# Patient Record
Sex: Female | Born: 1965 | Hispanic: Yes | Smoking: Never smoker
Health system: Southern US, Community
[De-identification: ages and names within clinical notes are randomized; demographics above are authoritative.]

## PROBLEM LIST (undated history)

## (undated) DIAGNOSIS — M549 Dorsalgia, unspecified: Secondary | ICD-10-CM

## (undated) DIAGNOSIS — G43909 Migraine, unspecified, not intractable, without status migrainosus: Secondary | ICD-10-CM

## (undated) DIAGNOSIS — E119 Type 2 diabetes mellitus without complications: Secondary | ICD-10-CM

## (undated) DIAGNOSIS — J45909 Unspecified asthma, uncomplicated: Secondary | ICD-10-CM

## (undated) DIAGNOSIS — K529 Noninfective gastroenteritis and colitis, unspecified: Secondary | ICD-10-CM

## (undated) DIAGNOSIS — J383 Other diseases of vocal cords: Secondary | ICD-10-CM

## (undated) HISTORY — PX: TRACHEOSTOMY: SUR1362

---

## 2020-05-02 DIAGNOSIS — J386 Stenosis of larynx: Secondary | ICD-10-CM | POA: Insufficient documentation

## 2020-05-15 DIAGNOSIS — Z9889 Other specified postprocedural states: Secondary | ICD-10-CM | POA: Insufficient documentation

## 2020-06-28 ENCOUNTER — Encounter (HOSPITAL_BASED_OUTPATIENT_CLINIC_OR_DEPARTMENT_OTHER): Payer: Self-pay | Admitting: Emergency Medicine

## 2020-06-28 ENCOUNTER — Emergency Department (HOSPITAL_BASED_OUTPATIENT_CLINIC_OR_DEPARTMENT_OTHER)
Admission: EM | Admit: 2020-06-28 | Discharge: 2020-06-29 | Disposition: A | Discharge status: Home / Self Care | Payer: Medicare Other | Attending: Emergency Medicine | Admitting: Emergency Medicine

## 2020-06-28 ENCOUNTER — Other Ambulatory Visit: Payer: Self-pay

## 2020-06-28 DIAGNOSIS — J9502 Infection of tracheostomy stoma: Secondary | ICD-10-CM | POA: Insufficient documentation

## 2020-06-28 DIAGNOSIS — Z91040 Latex allergy status: Secondary | ICD-10-CM | POA: Diagnosis not present

## 2020-06-28 DIAGNOSIS — J029 Acute pharyngitis, unspecified: Secondary | ICD-10-CM | POA: Diagnosis present

## 2020-06-28 DIAGNOSIS — E119 Type 2 diabetes mellitus without complications: Secondary | ICD-10-CM | POA: Insufficient documentation

## 2020-06-28 DIAGNOSIS — J45909 Unspecified asthma, uncomplicated: Secondary | ICD-10-CM | POA: Diagnosis not present

## 2020-06-28 HISTORY — DX: Type 2 diabetes mellitus without complications: E11.9

## 2020-06-28 HISTORY — DX: Other diseases of vocal cords: J38.3

## 2020-06-28 HISTORY — DX: Migraine, unspecified, not intractable, without status migrainosus: G43.909

## 2020-06-28 HISTORY — DX: Dorsalgia, unspecified: M54.9

## 2020-06-28 HISTORY — DX: Noninfective gastroenteritis and colitis, unspecified: K52.9

## 2020-06-28 HISTORY — DX: Unspecified asthma, uncomplicated: J45.909

## 2020-06-28 NOTE — ED Triage Notes (Signed)
Reports green discharge from trach stoma x4 days, sore throat x2 days. Denies fevers.

## 2020-06-28 NOTE — ED Triage Notes (Signed)
No answer for triage x1 

## 2020-06-29 DIAGNOSIS — J9502 Infection of tracheostomy stoma: Secondary | ICD-10-CM | POA: Diagnosis not present

## 2020-06-29 MED ORDER — SODIUM CHLORIDE 0.9 % IV SOLN
2.0000 g | Freq: Once | INTRAVENOUS | Status: AC
  Administered 2020-06-29: 2 g via INTRAVENOUS
  Filled 2020-06-29: qty 2

## 2020-06-29 MED ORDER — LEVOFLOXACIN 750 MG PO TABS: 750.0000 mg | ORAL_TABLET | Freq: Every day | ORAL | 0 refills | Status: DC

## 2020-06-29 NOTE — ED Provider Notes (Signed)
[facility] Provider Note: Lowella Dell, MD, FACEP  CSN: [id number] MRN: [id number] ARRIVAL: 06/28/20 at 2319 ROOM: MH07/[facility]07   CHIEF COMPLAINT  Sore Throat   HISTORY OF PRESENT ILLNESS  06/29/20 1:44 AM Carolyn Rhodes is a 54 y.o. female who has a tracheostomy due to vocal cord dysfunction following surgery for cervical fracture several years ago.  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  She is here with 3 days of pain and greenish drainage from around her tracheostomy tube.  She has not had a fever but has had some malaise.  She rates the associated pain is a 6 out of 10, worse with movement of the tracheostomy tube.  She is concerned she may have a new Pseudomonas infection.  She is also concerned that her Pseudomonas has become resistant to Levaquin.  Records from [facility] (she recently moved here from [place]) were reviewed but no Pseudomonas culture results could be found.   Past Medical History:  Diagnosis Date  . Asthma   . Back pain   . Colitis   . Diabetes mellitus without complication (HCC)   . Migraines   . Vocal cord dysfunction     Past Surgical History:  Procedure Laterality Date  . TRACHEOSTOMY      No family history on file.  Social History   Tobacco Use  . Smoking status: Never Smoker  . Smokeless tobacco: Never Used  Substance Use Topics  . Alcohol use: Not Currently  . Drug use: Not Currently    Prior to Admission medications   Medication Sig Start Date End Date Taking? Authorizing Provider  levofloxacin (LEVAQUIN) 750 MG tablet Take 1 tablet (750 mg total) by mouth daily. X 7 days 06/29/20   Caeley Dohrmann, MD    Allergies Oxycodone-acetaminophen, Latex, Metoclopramide, Sumatriptan, and Nitrofurantoin   REVIEW OF SYSTEMS  Negative except as noted here or in the History of Present Illness.   PHYSICAL EXAMINATION  Initial Vital Signs Blood pressure (!) 152/90, pulse  83, temperature 98 F (36.7 C), temperature source Oral, resp. rate 20, weight 81.6 kg, SpO2 97 %.  Examination General: Well-developed, well-nourished female in no acute distress; appearance consistent with age of record HENT: normocephalic; tracheostomy tube and Passy-Muir valve with tenderness pale green drainage but no erythema or warmth, sample sent for culture Eyes: pupils equal, round and reactive to light; extraocular muscles intact Neck: supple Heart: regular rate and rhythm Lungs: clear to auscultation bilaterally Abdomen: soft; nondistended; nontender; bowel sounds present Extremities: No deformity; full range of motion; pulses normal Neurologic: Awake, alert and oriented; motor function intact in all extremities and symmetric; no facial droop Skin: Warm and dry Psychiatric: Normal mood and affect   RESULTS  Summary of this visit's results, reviewed and interpreted by myself:   EKG Interpretation  Date/Time:    Ventricular Rate:    PR Interval:    QRS Duration:   QT Interval:    QTC Calculation:   R Axis:     Text Interpretation:        Laboratory Studies: No results found for this or any previous visit (from the past 24 hour(s)). Imaging Studies: No results found.  ED COURSE and MDM  Nursing notes, initial and subsequent vitals signs, including pulse oximetry, reviewed and interpreted by myself.  Vitals:   06/28/20 2354 06/29/20 0000 06/29/20 0155  BP: (!) 152/90  136/78  Pulse: 83  73  Resp: 20  16  Temp: 98 F (36.7 C)    TempSrc: Oral    SpO2: 97%  100%  Weight:  81.6 kg    Medications  ceFEPIme (MAXIPIME) 2 g in sodium chloride 0.9 % 100 mL IVPB (2 g Intravenous New Bag/Given 06/29/20 0253)    Drainage from around tracheostomy tube was sent for culture.  We will give the patient 2 g of cefepime in the ED and sent home on Pseudomonas.  She was advised that if the culture does grow out a resistant organism she may need to have arrangements made for  IV antibiotics to clear the infection.  PROCEDURES  Procedures   ED DIAGNOSES     ICD-10-CM   1. Tracheostomy infection (HCC)  J95.02        Kierstin January, Jonny Ruiz, MD 06/29/20 0998

## 2020-07-03 LAB — AEROBIC CULTURE W GRAM STAIN (SUPERFICIAL SPECIMEN)

## 2020-09-01 ENCOUNTER — Emergency Department (HOSPITAL_BASED_OUTPATIENT_CLINIC_OR_DEPARTMENT_OTHER): Payer: Medicare Other

## 2020-09-01 ENCOUNTER — Emergency Department (HOSPITAL_BASED_OUTPATIENT_CLINIC_OR_DEPARTMENT_OTHER)
Admission: EM | Admit: 2020-09-01 | Discharge: 2020-09-01 | Disposition: A | Discharge status: Home / Self Care | Payer: Medicare Other | Attending: Emergency Medicine | Admitting: Emergency Medicine

## 2020-09-01 ENCOUNTER — Other Ambulatory Visit: Payer: Self-pay

## 2020-09-01 ENCOUNTER — Encounter (HOSPITAL_BASED_OUTPATIENT_CLINIC_OR_DEPARTMENT_OTHER): Payer: Self-pay | Admitting: *Deleted

## 2020-09-01 DIAGNOSIS — R0602 Shortness of breath: Secondary | ICD-10-CM | POA: Diagnosis present

## 2020-09-01 DIAGNOSIS — Z91040 Latex allergy status: Secondary | ICD-10-CM | POA: Insufficient documentation

## 2020-09-01 DIAGNOSIS — R059 Cough, unspecified: Secondary | ICD-10-CM | POA: Insufficient documentation

## 2020-09-01 DIAGNOSIS — Z20822 Contact with and (suspected) exposure to covid-19: Secondary | ICD-10-CM | POA: Diagnosis not present

## 2020-09-01 DIAGNOSIS — M546 Pain in thoracic spine: Secondary | ICD-10-CM | POA: Insufficient documentation

## 2020-09-01 DIAGNOSIS — Z930 Tracheostomy status: Secondary | ICD-10-CM | POA: Insufficient documentation

## 2020-09-01 DIAGNOSIS — J45909 Unspecified asthma, uncomplicated: Secondary | ICD-10-CM | POA: Insufficient documentation

## 2020-09-01 DIAGNOSIS — E119 Type 2 diabetes mellitus without complications: Secondary | ICD-10-CM | POA: Insufficient documentation

## 2020-09-01 LAB — CBC WITH DIFFERENTIAL/PLATELET
Abs Immature Granulocytes: 0.01 10*3/uL (ref 0.00–0.07)
Basophils Absolute: 0 10*3/uL (ref 0.0–0.1)
Basophils Relative: 1 %
Eosinophils Absolute: 0.2 10*3/uL (ref 0.0–0.5)
Eosinophils Relative: 2 %
HCT: 34.6 % — ABNORMAL LOW (ref 36.0–46.0)
Hemoglobin: 11.1 g/dL — ABNORMAL LOW (ref 12.0–15.0)
Immature Granulocytes: 0 %
Lymphocytes Relative: 32 %
Lymphs Abs: 2.6 10*3/uL (ref 0.7–4.0)
MCH: 24.8 pg — ABNORMAL LOW (ref 26.0–34.0)
MCHC: 32.1 g/dL (ref 30.0–36.0)
MCV: 77.4 fL — ABNORMAL LOW (ref 80.0–100.0)
Monocytes Absolute: 0.6 10*3/uL (ref 0.1–1.0)
Monocytes Relative: 7 %
Neutro Abs: 4.7 10*3/uL (ref 1.7–7.7)
Neutrophils Relative %: 58 %
Platelets: 319 10*3/uL (ref 150–400)
RBC: 4.47 MIL/uL (ref 3.87–5.11)
RDW: 16.8 % — ABNORMAL HIGH (ref 11.5–15.5)
WBC: 8.1 10*3/uL (ref 4.0–10.5)
nRBC: 0 % (ref 0.0–0.2)

## 2020-09-01 LAB — BASIC METABOLIC PANEL
Anion gap: 10 (ref 5–15)
BUN: 10 mg/dL (ref 6–20)
CO2: 24 mmol/L (ref 22–32)
Calcium: 8.8 mg/dL — ABNORMAL LOW (ref 8.9–10.3)
Chloride: 104 mmol/L (ref 98–111)
Creatinine, Ser: 0.82 mg/dL (ref 0.44–1.00)
GFR, Estimated: >60 mL/min (ref >60)
Glucose, Bld: 95 mg/dL (ref 70–99)
Potassium: 3.6 mmol/L (ref 3.5–5.1)
Sodium: 138 mmol/L (ref 135–145)

## 2020-09-01 LAB — D-DIMER, QUANTITATIVE: D-Dimer, Quant: 0.38 ug/mL-FEU (ref 0.00–0.50)

## 2020-09-01 LAB — TROPONIN I (HIGH SENSITIVITY): Troponin I (High Sensitivity): <2 ng/L (ref <18)

## 2020-09-01 LAB — RESP PANEL BY RT-PCR (FLU A&B, COVID) ARPGX2
Influenza A by PCR: NEGATIVE
Influenza B by PCR: NEGATIVE
SARS Coronavirus 2 by RT PCR: NEGATIVE

## 2020-09-01 MED ORDER — HYDROCODONE-HOMATROPINE 5-1.5 MG/5ML PO SYRP
5.0000 mL | ORAL_SOLUTION | Freq: Four times a day (QID) | ORAL | 0 refills | Status: AC | PRN
Start: 2020-09-01 — End: ?

## 2020-09-01 MED ORDER — LEVOFLOXACIN 500 MG PO TABS: 500.0000 mg | ORAL_TABLET | Freq: Every day | ORAL | 0 refills | Status: AC

## 2020-09-01 MED ORDER — SODIUM CHLORIDE 0.9 % IV BOLUS
1000.0000 mL | Freq: Once | INTRAVENOUS | Status: AC
  Administered 2020-09-01: 1000 mL via INTRAVENOUS

## 2020-09-01 MED ORDER — LEVOFLOXACIN 500 MG PO TABS
500.0000 mg | ORAL_TABLET | Freq: Once | ORAL | Status: AC
  Administered 2020-09-01: 500 mg via ORAL
  Filled 2020-09-01: qty 1

## 2020-09-01 MED ORDER — KETOROLAC TROMETHAMINE 15 MG/ML IJ SOLN
15.0000 mg | Freq: Once | INTRAMUSCULAR | Status: AC
  Administered 2020-09-01: 15 mg via INTRAVENOUS
  Filled 2020-09-01: qty 1

## 2020-09-01 NOTE — ED Notes (Signed)
2 sets of blood cultures sent to lab along with a gray top.

## 2020-09-01 NOTE — ED Triage Notes (Addendum)
Increased SOb and cough, [place] right upper back pain , x 3 days, pt has a trach, 94% amb on 5 liters

## 2020-09-01 NOTE — Discharge Instructions (Addendum)
Please pick up medication and take as prescribed.  We have collected a culture and will call you in 2-3 days if it grows anything requiring different antibiotics  Please follow up with your PCP regarding ED visit today  Return to the ED for any worsening symptoms

## 2020-09-01 NOTE — ED Provider Notes (Signed)
[facility] EMERGENCY DEPARTMENT Provider Note   CSN: [id number] Arrival date & time: 09/01/20  1644     History Chief Complaint  Patient presents with  . Shortness of Breath    Carolyn Rhodes is a 54 y.o. female with PMHx tracheostomy (x 3 years) s/2 vocal cord dysfunction following surgery for cervical fracture and diabetes who presents to the ED today with complaint of shortness of breath and dry cough for the past 3 days.  She is also complaining of right upper back pain.  She states that she typically carries Pseudomonas in her trach; had leftover levofloxacin and took 1 dose earlier today.  She states that she will feel more short of breath when she has a Pseudomonas infection however states she does not typically have back pain.  She is unsure if the back pain is radiating into her chest however does state that it is pleuritic in nature.  She does mention that she saw family members for Thanksgiving who were sick before coming, she does not know if they were Covid tested.  Patient is unvaccinated.  She is typically on 5 L O2 and states she has not had to increase her oxygen requirement.  She does not believe she has had fevers.  No other complaints at this time.   The history is provided by the patient and medical records.       Past Medical History:  Diagnosis Date  . Asthma   . Back pain   . Colitis   . Diabetes mellitus without complication (HCC)   . Migraines   . Vocal cord dysfunction     There are no problems to display for this patient.   Past Surgical History:  Procedure Laterality Date  . TRACHEOSTOMY       OB History   No obstetric history on file.     No family history on file.  Social History   Tobacco Use  . Smoking status: Never Smoker  . Smokeless tobacco: Never Used  Substance Use Topics  . Alcohol use: Not Currently  . Drug use: Not Currently    Home Medications Prior to Admission medications   Medication Sig Start Date End  Date Taking? Authorizing Provider  HYDROcodone-homatropine (HYDROMET) 5-1.5 MG/5ML syrup Take 5 mLs by mouth every 6 (six) hours as needed for cough. 09/01/20   Hyman Hopes, Jaelani Posa, PA-C  levofloxacin (LEVAQUIN) 500 MG tablet Take 1 tablet (500 mg total) by mouth daily. 09/01/20   Tanda Rockers, PA-C    Allergies    Oxycodone-acetaminophen, Latex, Metoclopramide, Sumatriptan, and Nitrofurantoin  Review of Systems   Review of Systems  Constitutional: Negative for chills and fever.  Respiratory: Positive for cough and shortness of breath.   Musculoskeletal: Positive for back pain.  All other systems reviewed and are negative.   Physical Exam Updated Vital Signs Pulse (!) 102   Temp 98.3 F (36.8 C)   Resp 20   Ht 5' (1.524 m)   Wt 79.4 kg   SpO2 100%   BMI 34.18 kg/m   Physical Exam Vitals and nursing note reviewed.  Constitutional:      Appearance: She is not ill-appearing.  HENT:     Head: Normocephalic and atraumatic.  Eyes:     Conjunctiva/sclera: Conjunctivae normal.  Neck:     Comments: Trach in place; no obvious secretions appreciated Cardiovascular:     Rate and Rhythm: Normal rate and regular rhythm.  Pulmonary:     Effort: Pulmonary effort is normal.  Breath sounds: Normal breath sounds. No decreased breath sounds, wheezing, rhonchi or rales.     Comments: Able to speak in full sentences without difficulty. On chronic 5L O2 via [place] and satting 100%.  Chest:     Chest wall: No tenderness.  Abdominal:     Palpations: Abdomen is soft.     Tenderness: There is no abdominal tenderness. There is no guarding or rebound.  Musculoskeletal:     Cervical back: Neck supple.       Back:     Right lower leg: No edema.     Left lower leg: No edema.     Comments: No C or T midline spinal TTP. + right sided parathoracic musculature TTP.   Skin:    General: Skin is warm and dry.  Neurological:     Mental Status: She is alert.     ED Results / Procedures / Treatments    Labs (all labs ordered are listed, but only abnormal results are displayed) Labs Reviewed  BASIC METABOLIC PANEL - Abnormal; Notable for the following components:      Result Value   Calcium 8.8 (*)    All other components within normal limits  CBC WITH DIFFERENTIAL/PLATELET - Abnormal; Notable for the following components:   Hemoglobin 11.1 (*)    HCT 34.6 (*)    MCV 77.4 (*)    MCH 24.8 (*)    RDW 16.8 (*)    All other components within normal limits  RESP PANEL BY RT-PCR (FLU A&B, COVID) ARPGX2  AEROBIC CULTURE (SUPERFICIAL SPECIMEN)  D-DIMER, QUANTITATIVE (NOT AT [facility])  TROPONIN I (HIGH SENSITIVITY)    EKG None  Radiology DG Chest Port 1 View  Result Date: 09/01/2020 CLINICAL DATA:  Cough with shortness of breath. Chronic tracheostomy. EXAM: PORTABLE CHEST 1 VIEW COMPARISON:  None. FINDINGS: 1748 hours. Tracheostomy is partly obscured by previous lower cervical fusion hardware, but terminates above the carina. The heart size and mediastinal contours are normal. The lungs appear clear. There is no pleural effusion or pneumothorax. No acute osseous findings are evident. Telemetry leads overlie the chest. IMPRESSION: No active cardiopulmonary process. Tracheostomy in satisfactory position. Electronically Signed   By: Carey BullocksWilliam  Veazey M.D.   On: 09/01/2020 18:29    Procedures Procedures (including critical care time)  Medications Ordered in ED Medications  levofloxacin (LEVAQUIN) tablet 500 mg (has no administration in time range)  ketorolac (TORADOL) 15 MG/ML injection 15 mg (15 mg Intravenous Given 09/01/20 1956)  sodium chloride 0.9 % bolus 1,000 mL (1,000 mLs Intravenous New Bag/Given 09/01/20 1954)    ED Course  I have reviewed the triage vital signs and the nursing notes.  Pertinent labs & imaging results that were available during my care of the patient were reviewed by me and considered in my medical decision making (see chart for details).  Clinical Course as of  Sep 01 2052  Mon Sep 01, 2020  1937 SARS Coronavirus 2 by RT PCR: NEGATIVE [MV]    Clinical Course User Index [MV] Tanda RockersVenter, Qamar Rosman, PA-C   MDM Rules/Calculators/A&P                          54 year old female with a history of tracheostomy who presents to the ED today with complaint of shortness of breath and cough for the past 3 days with right-sided upper back pain.  Was around family members during Thanksgiving holidays who were sick 2 weeks prior, is unsure if  they were tested for Covid however patient is unvaccinated.  She does report that she carries Pseudomonas quite frequently in her tracheostomy and typically has the symptoms however does not typically have back pain.  She describes the back pain is pleuritic in nature, she is unsure if it radiates into her chest.  Arrival to the ED patient is afebrile, mildly tachycardic in the low 100s however this is resolved while she is in her room into the 90s, nontachypneic.  She is chronically on 5 L and satting 100%.  Feels like she has increased accretions from her tracheostomy however respiratory therapist has evaluated patient does not appreciate any increase in secretions nor do I.  Janina Mayo is in place.  On exam lungs are clear to auscultation bilaterally.  Plan for EKG, chest x-ray, lab work as well as Covid test.   CBC without leukocytosis.  Hemoglobin stable at 11.1. BMP without electrolyte abnormalities. Troponin less than 2, do not feel patient needs repeat troponin at this time. Dimer negative at 0.38. X-ray clear. Covid and flu test negative.  During ED visit patient began complaining of a migraine headache.  She reports Toradol typically works well for her, fluids and Toradol provided.  Reevaluation patient states her headache has improved.  Work-up essentially negative at this time.  Suspect that she is having cough secondary to viral illness however patient does report that she again typically colonized with Pseudomonas and that  when she feels the short of breath she is typically prescribed Levaquin.  Have discussed with her starting Levaquin today versus waiting for the tracheostomy culture report, she feels more comfortable starting today.  She is also requesting cough medication, states that Hydromet typically works for her.  Will discharge home at this time with Levaquin and Hydromet I have patient follow-up with her PCP.  She is in agreement with plan at this time stable for discharge.   This note was prepared using Dragon voice recognition software and may include unintentional dictation errors due to the inherent limitations of voice recognition software.  Jaydence Vanyo was evaluated in Emergency Department on 09/01/2020 for the symptoms described in the history of present illness. She was evaluated in the context of the global COVID-19 pandemic, which necessitated consideration that the patient might be at risk for infection with the SARS-CoV-2 virus that causes COVID-19. Institutional protocols and algorithms that pertain to the evaluation of patients at risk for COVID-19 are in a state of rapid change based on information released by regulatory bodies including the CDC and federal and state organizations. These policies and algorithms were followed during the patient's care in the ED.  Final Clinical Impression(s) / ED Diagnoses Final diagnoses:  Shortness of breath  Tracheostomy dependence (HCC)    Rx / DC Orders ED Discharge Orders         Ordered    HYDROcodone-homatropine (HYDROMET) 5-1.5 MG/5ML syrup  Every 6 hours PRN        09/01/20 2051    levofloxacin (LEVAQUIN) 500 MG tablet  Daily        09/01/20 2051           Discharge Instructions     Please pick up medication and take as prescribed.  We have collected a culture and will call you in 2-3 days if it grows anything requiring different antibiotics  Please follow up with your PCP regarding ED visit today  Return to the ED for any worsening  symptoms       Hyman Hopes,  Charlane Ferretti 09/01/20 2053    Sabino Donovan, MD 09/02/20 1501

## 2020-09-01 NOTE — ED Notes (Signed)
Patient stated she has had a trach for 3 years. 6 cuffless bivona in place. BBS clear, diminished in bases. Stated she isn't getting anything up, not even with suction. SAT 100% with home o2 and PMV in place. Trach clean, dry and secure.

## 2020-09-02 ENCOUNTER — Telehealth (HOSPITAL_BASED_OUTPATIENT_CLINIC_OR_DEPARTMENT_OTHER): Payer: Self-pay

## 2020-09-02 NOTE — Telephone Encounter (Signed)
This RN received a call from [facility] stating that patient already has phenergan with codeine prescribed and has an allergy to [employer], questioning if he should fill RX for cough syrup, [place] ED MD advises not to fill at this time.

## 2020-09-05 LAB — AEROBIC CULTURE W GRAM STAIN (SUPERFICIAL SPECIMEN): Gram Stain: NONE SEEN

## 2020-09-16 ENCOUNTER — Institutional Professional Consult (permissible substitution): Payer: Medicare Other | Admitting: Pulmonary Disease

## 2021-01-06 ENCOUNTER — Emergency Department (HOSPITAL_BASED_OUTPATIENT_CLINIC_OR_DEPARTMENT_OTHER)
Admission: EM | Admit: 2021-01-06 | Discharge: 2021-01-06 | Disposition: A | Discharge status: Home / Self Care | Payer: Medicare Other | Attending: Emergency Medicine | Admitting: Emergency Medicine

## 2021-01-06 ENCOUNTER — Other Ambulatory Visit: Payer: Self-pay

## 2021-01-06 ENCOUNTER — Emergency Department (HOSPITAL_BASED_OUTPATIENT_CLINIC_OR_DEPARTMENT_OTHER): Payer: Medicare Other

## 2021-01-06 ENCOUNTER — Encounter (HOSPITAL_BASED_OUTPATIENT_CLINIC_OR_DEPARTMENT_OTHER): Payer: Self-pay

## 2021-01-06 DIAGNOSIS — R059 Cough, unspecified: Secondary | ICD-10-CM | POA: Diagnosis present

## 2021-01-06 DIAGNOSIS — Z20822 Contact with and (suspected) exposure to covid-19: Secondary | ICD-10-CM | POA: Insufficient documentation

## 2021-01-06 DIAGNOSIS — Z91040 Latex allergy status: Secondary | ICD-10-CM | POA: Diagnosis not present

## 2021-01-06 DIAGNOSIS — J45909 Unspecified asthma, uncomplicated: Secondary | ICD-10-CM | POA: Insufficient documentation

## 2021-01-06 DIAGNOSIS — J069 Acute upper respiratory infection, unspecified: Secondary | ICD-10-CM | POA: Diagnosis not present

## 2021-01-06 DIAGNOSIS — E119 Type 2 diabetes mellitus without complications: Secondary | ICD-10-CM | POA: Diagnosis not present

## 2021-01-06 DIAGNOSIS — Z28310 Unvaccinated for covid-19: Secondary | ICD-10-CM | POA: Insufficient documentation

## 2021-01-06 LAB — CBC WITH DIFFERENTIAL/PLATELET
Abs Immature Granulocytes: 0.02 10*3/uL (ref 0.00–0.07)
Basophils Absolute: 0 10*3/uL (ref 0.0–0.1)
Basophils Relative: 0 %
Eosinophils Absolute: 0.3 10*3/uL (ref 0.0–0.5)
Eosinophils Relative: 3 %
HCT: 37 % (ref 36.0–46.0)
Hemoglobin: 12.2 g/dL (ref 12.0–15.0)
Immature Granulocytes: 0 %
Lymphocytes Relative: 21 %
Lymphs Abs: 1.9 10*3/uL (ref 0.7–4.0)
MCH: 26.9 pg (ref 26.0–34.0)
MCHC: 33 g/dL (ref 30.0–36.0)
MCV: 81.7 fL (ref 80.0–100.0)
Monocytes Absolute: 0.6 10*3/uL (ref 0.1–1.0)
Monocytes Relative: 7 %
Neutro Abs: 6.3 10*3/uL (ref 1.7–7.7)
Neutrophils Relative %: 69 %
Platelets: 298 10*3/uL (ref 150–400)
RBC: 4.53 MIL/uL (ref 3.87–5.11)
RDW: 16.2 % — ABNORMAL HIGH (ref 11.5–15.5)
WBC: 9.2 10*3/uL (ref 4.0–10.5)
nRBC: 0 % (ref 0.0–0.2)

## 2021-01-06 LAB — COMPREHENSIVE METABOLIC PANEL
ALT: 18 U/L (ref 0–44)
AST: 18 U/L (ref 15–41)
Albumin: 3.8 g/dL (ref 3.5–5.0)
Alkaline Phosphatase: 66 U/L (ref 38–126)
Anion gap: 11 (ref 5–15)
BUN: 13 mg/dL (ref 6–20)
CO2: 25 mmol/L (ref 22–32)
Calcium: 8.9 mg/dL (ref 8.9–10.3)
Chloride: 99 mmol/L (ref 98–111)
Creatinine, Ser: 0.76 mg/dL (ref 0.44–1.00)
GFR, Estimated: >60 mL/min (ref >60)
Glucose, Bld: 98 mg/dL (ref 70–99)
Potassium: 4.3 mmol/L (ref 3.5–5.1)
Sodium: 135 mmol/L (ref 135–145)
Total Bilirubin: 0.3 mg/dL (ref 0.3–1.2)
Total Protein: 7.5 g/dL (ref 6.5–8.1)

## 2021-01-06 LAB — URINALYSIS, ROUTINE W REFLEX MICROSCOPIC
Bilirubin Urine: NEGATIVE
Glucose, UA: NEGATIVE mg/dL
Hgb urine dipstick: NEGATIVE
Ketones, ur: NEGATIVE mg/dL
Leukocytes,Ua: NEGATIVE
Nitrite: NEGATIVE
Protein, ur: NEGATIVE mg/dL
Specific Gravity, Urine: 1.025 (ref 1.005–1.030)
pH: 5 (ref 5.0–8.0)

## 2021-01-06 LAB — RESP PANEL BY RT-PCR (FLU A&B, COVID) ARPGX2
Influenza A by PCR: NEGATIVE
Influenza B by PCR: NEGATIVE
SARS Coronavirus 2 by RT PCR: NEGATIVE

## 2021-01-06 MED ORDER — DIPHENHYDRAMINE HCL 50 MG/ML IJ SOLN
25.0000 mg | Freq: Once | INTRAMUSCULAR | Status: AC
  Administered 2021-01-06: 25 mg via INTRAVENOUS
  Filled 2021-01-06: qty 1

## 2021-01-06 MED ORDER — IBUPROFEN 400 MG PO TABS
600.0000 mg | ORAL_TABLET | Freq: Once | ORAL | Status: AC
  Administered 2021-01-06: 600 mg via ORAL
  Filled 2021-01-06: qty 1

## 2021-01-06 MED ORDER — ALBUTEROL SULFATE HFA 108 (90 BASE) MCG/ACT IN AERS
4.0000 | INHALATION_SPRAY | Freq: Once | RESPIRATORY_TRACT | Status: AC
  Administered 2021-01-06: 4 via RESPIRATORY_TRACT
  Filled 2021-01-06: qty 6.7

## 2021-01-06 MED ORDER — PROCHLORPERAZINE EDISYLATE 10 MG/2ML IJ SOLN
10.0000 mg | Freq: Once | INTRAMUSCULAR | Status: AC
  Administered 2021-01-06: 10 mg via INTRAVENOUS
  Filled 2021-01-06: qty 2

## 2021-01-06 NOTE — ED Provider Notes (Signed)
[facility] EMERGENCY DEPARTMENT Provider Note   CSN: [id number] Arrival date & time: 01/06/21  1051     History Chief Complaint  Patient presents with  . Cough    Carolyn Rhodes is a 55 y.o. female.  HPI 55 year old female with a history of vocal cord dysfunction and tracheostomy, asthma, DM type II, migraines presents to the ER with complaints of body aches, nasal congestion, cough.  She is normally on 5 L nasal cannula at baseline.  She started to feel worse at night with a nonproductive cough and body aches.  She called her PCPs office but they were booked till Wednesday and did not want to wait to be seen.  She also complains of headache, no nausea, vision changes, states this does feel like her prior migraines.  She states that she was recently treated for pneumonia (per chart review, appears to be due to Pseudomonas Per chart review, and recently finished a course of antibiotics.  She was afraid that she may have any worsening infection and thus came to the ER.  She denies any abdominal pain, nausea or vomiting.  She is not vaccinated for COVID-19.    Past Medical History:  Diagnosis Date  . Asthma   . Back pain   . Colitis   . Diabetes mellitus without complication (HCC)   . Migraines   . Vocal cord dysfunction     There are no problems to display for this patient.   Past Surgical History:  Procedure Laterality Date  . TRACHEOSTOMY       OB History   No obstetric history on file.     No family history on file.  Social History   Tobacco Use  . Smoking status: Never Smoker  . Smokeless tobacco: Never Used  Substance Use Topics  . Alcohol use: Not Currently  . Drug use: Not Currently    Home Medications Prior to Admission medications   Medication Sig Start Date End Date Taking? Authorizing Provider  HYDROcodone-homatropine (HYDROMET) 5-1.5 MG/5ML syrup Take 5 mLs by mouth every 6 (six) hours as needed for cough. 09/01/20   Hyman Hopes, Margaux, PA-C   levofloxacin (LEVAQUIN) 500 MG tablet Take 1 tablet (500 mg total) by mouth daily. 09/01/20   Tanda Rockers, PA-C    Allergies    Oxycodone-acetaminophen, Latex, Metoclopramide, Sumatriptan, and Nitrofurantoin  Review of Systems   Review of Systems  Constitutional: Positive for chills. Negative for fever.  HENT: Positive for congestion and sore throat. Negative for ear pain and voice change.   Eyes: Negative for pain and visual disturbance.  Respiratory: Negative for cough and shortness of breath.   Cardiovascular: Negative for chest pain and palpitations.  Gastrointestinal: Negative for abdominal pain and vomiting.  Genitourinary: Negative for dysuria and hematuria.  Musculoskeletal: Negative for arthralgias and back pain.  Skin: Negative for color change and rash.  Neurological: Negative for seizures and syncope.  All other systems reviewed and are negative.   Physical Exam Updated Vital Signs BP (!) 114/56   Pulse 96   Temp 98.8 F (37.1 C) (Oral)   Resp 20   Ht 5' (1.524 m)   Wt 80.3 kg   SpO2 99%   BMI 34.57 kg/m   Physical Exam Vitals and nursing note reviewed.  Constitutional:      General: She is not in acute distress.    Appearance: She is well-developed. She is not ill-appearing or diaphoretic.  HENT:     Head: Normocephalic and atraumatic.  Nose: Congestion present.  Eyes:     Conjunctiva/sclera: Conjunctivae normal.  Cardiovascular:     Rate and Rhythm: Normal rate and regular rhythm.     Pulses: Normal pulses.     Heart sounds: Normal heart sounds. No murmur heard.   Pulmonary:     Effort: Pulmonary effort is normal. No respiratory distress.     Breath sounds: Normal breath sounds.     Comments: Tracheostomy in place, no surrounding erythema, drainage. right wrist with limited flexion extension secondary to pain.  No overlying skin changes.  2+ radial pulses Abdominal:     Palpations: Abdomen is soft.     Tenderness: There is no abdominal  tenderness.  Musculoskeletal:        General: Normal range of motion.     Cervical back: Neck supple.     Right lower leg: No edema.     Left lower leg: No edema.  Skin:    General: Skin is warm and dry.  Neurological:     Mental Status: She is alert.     ED Results / Procedures / Treatments   Labs (all labs ordered are listed, but only abnormal results are displayed) Labs Reviewed  CBC WITH DIFFERENTIAL/PLATELET - Abnormal; Notable for the following components:      Result Value   RDW 16.2 (*)    All other components within normal limits  RESP PANEL BY RT-PCR (FLU A&B, COVID) ARPGX2  URINALYSIS, ROUTINE W REFLEX MICROSCOPIC  COMPREHENSIVE METABOLIC PANEL  CBC WITH DIFFERENTIAL/PLATELET    EKG None  Radiology DG Chest Portable 1 View  Result Date: 01/06/2021 CLINICAL DATA:  Cough EXAM: PORTABLE CHEST 1 VIEW COMPARISON:  09/01/2020 FINDINGS: Tracheostomy tip 4.1 cm above the carina. Normal heart size and vascularity. Lungs remain clear. Negative for pneumonia or edema. No effusion or pneumothorax. Trachea midline. Lower cervical fusion hardware evident. No acute osseous finding. IMPRESSION: Stable exam.  No active chest disease. Electronically Signed   By: Judie Petit.  Shick M.D.   On: 01/06/2021 12:31    Procedures Procedures   Medications Ordered in ED Medications  ibuprofen (ADVIL) tablet 600 mg (600 mg Oral Given 01/06/21 1312)  albuterol (VENTOLIN HFA) 108 (90 Base) MCG/ACT inhaler 4 puff (4 puffs Inhalation Given 01/06/21 1247)  prochlorperazine (COMPAZINE) injection 10 mg (10 mg Intravenous Given 01/06/21 1552)  diphenhydrAMINE (BENADRYL) injection 25 mg (25 mg Intravenous Given 01/06/21 1552)    ED Course  I have reviewed the triage vital signs and the nursing notes.  Pertinent labs & imaging results that were available during my care of the patient were reviewed by me and considered in my medical decision making (see chart for details).    MDM Rules/Calculators/A&P                           55 year old female with nasal congestion, dry cough, chills.  On arrival, she is well-appearing, no acute distress, resting comfortably in the ER bed.  Speaking full sentences without increased work of breathing.  Vitals overall reassuring, afebrile, not tachycardic, tachypneic or hypoxic.  No evidence of sepsis.  No evidence of infection to the trach.  Lung sounds clear.  She does have nasal congestion noted on exam.  Labs and imaging ordered, reviewed and interpreted by me.  BC and CMP unremarkable, UA without evidence of infection.  Covid and flu are negative.  Chest x-ray without evidence of pneumonia.    Patient was given ibuprofen initially  with little relief of her headache.  That she was then given a migraine cocktail and notes some relief in her symptoms.  She was also given albuterol given her history of asthma.  No evidence of acute asthma attack.  She was ambulated with nursing staff and remained at 98% on her 5 L nasal cannula.  Overall work-up reassuring.  No evidence of sepsis, no evidence of infection.  Suspect viral URI/possible allergies.  Stressed the patient to keep her PCP appointment and follow-up with them.  We discussed return precautions.  She voiced understanding and is agreeable.  Stable for discharge.    Final Clinical Impression(s) / ED Diagnoses Final diagnoses:  Upper respiratory tract infection, unspecified type    Rx / DC Orders ED Discharge Orders    None       Mare Ferrari, PA-C 01/06/21 1641    Rolan Bucco, MD 01/24/21 [phone]

## 2021-01-06 NOTE — Progress Notes (Signed)
Patient ambulated with pulse ox. Wore 5LNC per her home reg. No SOB, SAT 97% throughout.

## 2021-01-06 NOTE — ED Notes (Signed)
Pt stated sore throat and headache started Sunday. Started feeling worse last night d/t increased non-productive cough and body chills. Spoke to primary care clinic but didn't want to wait until Wednesday's appointment to be seen.

## 2021-01-06 NOTE — ED Triage Notes (Signed)
Pt c/o flu like sx started day 2-NAD-steady gait-wearing O2 5 L [place]-RT in triage for assessment

## 2021-01-06 NOTE — Discharge Instructions (Signed)
You were evaluated in the Emergency Department and after careful evaluation, we did not find any emergent condition requiring admission or further testing in the hospital. ° °Please return to the Emergency Department if you experience any worsening of your condition.  We encourage you to follow up with a primary care provider.  Thank you for allowing us to be a part of your care. °

## 2021-01-06 NOTE — ED Notes (Signed)
ED Provider at bedside. 

## 2021-01-06 NOTE — ED Notes (Signed)
Patient given a tank in Triage. Wears 5L at home, and the battery on her concentrator was low. Patient has a trach, stated she has had it for years. 6.0 cuffless Bivona and wearing PMV. SAT 99%, no distress noted.

## 2021-05-12 DIAGNOSIS — J984 Other disorders of lung: Secondary | ICD-10-CM | POA: Insufficient documentation

## 2021-06-04 ENCOUNTER — Emergency Department (HOSPITAL_BASED_OUTPATIENT_CLINIC_OR_DEPARTMENT_OTHER): Payer: Medicare Other

## 2021-06-04 ENCOUNTER — Emergency Department (HOSPITAL_BASED_OUTPATIENT_CLINIC_OR_DEPARTMENT_OTHER)
Admission: EM | Admit: 2021-06-04 | Discharge: 2021-06-04 | Disposition: A | Discharge status: Home / Self Care | Payer: Medicare Other | Attending: Emergency Medicine | Admitting: Emergency Medicine

## 2021-06-04 ENCOUNTER — Other Ambulatory Visit: Payer: Self-pay

## 2021-06-04 DIAGNOSIS — J45909 Unspecified asthma, uncomplicated: Secondary | ICD-10-CM | POA: Insufficient documentation

## 2021-06-04 DIAGNOSIS — Z9889 Other specified postprocedural states: Secondary | ICD-10-CM

## 2021-06-04 DIAGNOSIS — Z20822 Contact with and (suspected) exposure to covid-19: Secondary | ICD-10-CM | POA: Insufficient documentation

## 2021-06-04 DIAGNOSIS — E119 Type 2 diabetes mellitus without complications: Secondary | ICD-10-CM | POA: Insufficient documentation

## 2021-06-04 DIAGNOSIS — Z91040 Latex allergy status: Secondary | ICD-10-CM | POA: Insufficient documentation

## 2021-06-04 DIAGNOSIS — J386 Stenosis of larynx: Secondary | ICD-10-CM | POA: Diagnosis not present

## 2021-06-04 DIAGNOSIS — J984 Other disorders of lung: Secondary | ICD-10-CM | POA: Diagnosis not present

## 2021-06-04 DIAGNOSIS — R0602 Shortness of breath: Secondary | ICD-10-CM | POA: Diagnosis present

## 2021-06-04 DIAGNOSIS — Z930 Tracheostomy status: Secondary | ICD-10-CM | POA: Insufficient documentation

## 2021-06-04 LAB — CBC WITH DIFFERENTIAL/PLATELET
Abs Immature Granulocytes: 0.03 10*3/uL (ref 0.00–0.07)
Basophils Absolute: 0.1 10*3/uL (ref 0.0–0.1)
Basophils Relative: 1 %
Eosinophils Absolute: 0.2 10*3/uL (ref 0.0–0.5)
Eosinophils Relative: 2 %
HCT: 40.5 % (ref 36.0–46.0)
Hemoglobin: 13.4 g/dL (ref 12.0–15.0)
Immature Granulocytes: 0 %
Lymphocytes Relative: 28 %
Lymphs Abs: 3.1 10*3/uL (ref 0.7–4.0)
MCH: 28.3 pg (ref 26.0–34.0)
MCHC: 33.1 g/dL (ref 30.0–36.0)
MCV: 85.4 fL (ref 80.0–100.0)
Monocytes Absolute: 0.6 10*3/uL (ref 0.1–1.0)
Monocytes Relative: 5 %
Neutro Abs: 7.1 10*3/uL (ref 1.7–7.7)
Neutrophils Relative %: 64 %
Platelets: 330 10*3/uL (ref 150–400)
RBC: 4.74 MIL/uL (ref 3.87–5.11)
RDW: 13.8 % (ref 11.5–15.5)
WBC: 11.1 10*3/uL — ABNORMAL HIGH (ref 4.0–10.5)
nRBC: 0 % (ref 0.0–0.2)

## 2021-06-04 LAB — COMPREHENSIVE METABOLIC PANEL
ALT: 19 U/L (ref 0–44)
AST: 30 U/L (ref 15–41)
Albumin: 4 g/dL (ref 3.5–5.0)
Alkaline Phosphatase: 87 U/L (ref 38–126)
Anion gap: 12 (ref 5–15)
BUN: 13 mg/dL (ref 6–20)
CO2: 26 mmol/L (ref 22–32)
Calcium: 9 mg/dL (ref 8.9–10.3)
Chloride: 99 mmol/L (ref 98–111)
Creatinine, Ser: 0.94 mg/dL (ref 0.44–1.00)
GFR, Estimated: >60 mL/min (ref >60)
Glucose, Bld: 207 mg/dL — ABNORMAL HIGH (ref 70–99)
Potassium: 4 mmol/L (ref 3.5–5.1)
Sodium: 137 mmol/L (ref 135–145)
Total Bilirubin: 0.3 mg/dL (ref 0.3–1.2)
Total Protein: 8.4 g/dL — ABNORMAL HIGH (ref 6.5–8.1)

## 2021-06-04 LAB — BRAIN NATRIURETIC PEPTIDE: B Natriuretic Peptide: 8.4 pg/mL (ref 0.0–100.0)

## 2021-06-04 LAB — TROPONIN I (HIGH SENSITIVITY): Troponin I (High Sensitivity): <2 ng/L (ref <18)

## 2021-06-04 LAB — RESP PANEL BY RT-PCR (FLU A&B, COVID) ARPGX2
Influenza A by PCR: NEGATIVE
Influenza B by PCR: NEGATIVE
SARS Coronavirus 2 by RT PCR: NEGATIVE

## 2021-06-04 LAB — D-DIMER, QUANTITATIVE: D-Dimer, Quant: <0.27 ug/mL-FEU (ref 0.00–0.50)

## 2021-06-04 MED ORDER — IBUPROFEN 400 MG PO TABS
600.0000 mg | ORAL_TABLET | Freq: Once | ORAL | Status: AC
  Administered 2021-06-04: 600 mg via ORAL
  Filled 2021-06-04: qty 1

## 2021-06-04 MED ORDER — BENZONATATE 100 MG PO CAPS
100.0000 mg | ORAL_CAPSULE | Freq: Once | ORAL | Status: AC
  Administered 2021-06-04: 100 mg via ORAL
  Filled 2021-06-04: qty 1

## 2021-06-04 MED ORDER — IPRATROPIUM-ALBUTEROL 0.5-2.5 (3) MG/3ML IN SOLN
3.0000 mL | Freq: Once | RESPIRATORY_TRACT | Status: AC
  Administered 2021-06-04: 3 mL via RESPIRATORY_TRACT
  Filled 2021-06-04: qty 3

## 2021-06-04 NOTE — ED Provider Notes (Signed)
[facility] EMERGENCY DEPARTMENT Provider Note   CSN: [id number] Arrival date & time: 06/04/21  1211     History Chief Complaint  Patient presents with   Shortness of Breath    Carolyn Rhodes is a 55 y.o. female.  Carolyn Rhodes has a history of restrictive lung disease and tracheal stenosis status post tracheostomy.  She has an O2 requirement of 5 L.  She saw pulmonology several weeks ago and was diagnosed with bronchiectasis.  She was placed on 1 month of azithromycin.  She presents stating that her symptoms are not getting any better.  She is having persistent shortness of breath and cough. O2 requirement has not changed.  The history is provided by the patient.  Shortness of Breath Severity:  Moderate Onset quality:  Gradual Duration:  3 weeks Timing:  Constant Progression:  Worsening Chronicity:  Recurrent Context comment:  No known cause Relieved by:  Nothing Worsened by:  Nothing Ineffective treatments: nebulizer. 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     No family history on file.  Social History   Tobacco Use   Smoking status: Never   Smokeless tobacco: Never  Substance Use Topics   Alcohol use: Not Currently   Drug use: Not Currently    Home Medications Prior to Admission medications   Medication Sig Start Date End Date Taking? Authorizing Provider  HYDROcodone-homatropine (HYDROMET)  5-1.5 MG/5ML syrup Take 5 mLs by mouth every 6 (six) hours as needed for cough. 09/01/20   Hyman Hopes, Margaux, PA-C  levofloxacin (LEVAQUIN) 500 MG tablet Take 1 tablet (500 mg total) by mouth daily. 09/01/20   Tanda Rockers, PA-C    Allergies    Oxycodone-acetaminophen, Latex, Metoclopramide, Sumatriptan, and Nitrofurantoin  Review of Systems   Review of Systems  Constitutional:  Negative for chills and fever.  HENT:  Negative for ear pain and sore throat.   Eyes:  Negative for pain and visual disturbance.  Respiratory:  Positive for cough and shortness of breath.   Cardiovascular:  Negative for chest pain and palpitations.  Gastrointestinal:  Negative for abdominal pain and vomiting.  Genitourinary:  Negative for dysuria and hematuria.  Musculoskeletal:  Negative for arthralgias and back pain.  Skin:  Negative for color change and rash.  Neurological:  Negative for seizures and syncope.  All other systems reviewed and are negative.  Physical Exam Updated Vital Signs BP (!) 109/54   Pulse (!) 110   Resp (!) 21   Ht 5' (1.524 m)   Wt 87.9 kg   SpO2 99%   BMI 37.85 kg/m   Physical Exam Vitals and nursing note reviewed.  Constitutional:      Appearance: She is well-developed.  HENT:     Head: Normocephalic and atraumatic.  Cardiovascular:     Rate and Rhythm:  Regular rhythm. Tachycardia present.     Heart sounds: Normal heart sounds.  Pulmonary:     Effort: Pulmonary effort is normal. No tachypnea.     Breath sounds: Examination of the right-upper field reveals wheezing. Examination of the left-upper field reveals wheezing. Examination of the right-middle field reveals wheezing. Examination of the left-middle field reveals wheezing. Examination of the right-lower field reveals wheezing. Examination of the left-lower field reveals wheezing. Wheezing present.  Abdominal:     Palpations: Abdomen is soft.     Tenderness: There is no abdominal tenderness.  Musculoskeletal:      Right lower leg: No edema.     Left lower leg: No edema.  Skin:    General: Skin is warm and dry.  Neurological:     General: No focal deficit present.     Mental Status: She is alert and oriented to person, place, and time.  Psychiatric:        Mood and Affect: Mood normal.        Behavior: Behavior normal.    ED Results / Procedures / Treatments   Labs (all labs ordered are listed, but only abnormal results are displayed) Labs Reviewed  CBC WITH DIFFERENTIAL/PLATELET - Abnormal; Notable for the following components:      Result Value   WBC 11.1 (*)    All other components within normal limits  COMPREHENSIVE METABOLIC PANEL - Abnormal; Notable for the following components:   Glucose, Bld 207 (*)    Total Protein 8.4 (*)    All other components within normal limits  RESP PANEL BY RT-PCR (FLU A&B, COVID) ARPGX2  BRAIN NATRIURETIC PEPTIDE  D-DIMER, QUANTITATIVE (NOT AT [facility])  TROPONIN I (HIGH SENSITIVITY)    EKG EKG Interpretation  Date/Time:  Thursday June 04 2021 12:23:56 EDT Ventricular Rate:  118 PR Interval:  132 QRS Duration: 81 QT Interval:  321 QTC Calculation: 450 R Axis:   59 Text Interpretation: Sinus tachycardia Low voltage, precordial leads Abnormal R-wave progression, early transition Minimal ST depression, inferior leads Axis normal Confirmed by Pieter Partridge ([id number]) on 06/04/2021 12:42:34 PM  Radiology CT Chest Wo Contrast  Result Date: 06/04/2021 CLINICAL DATA:  Shortness of breath. EXAM: CT CHEST WITHOUT CONTRAST TECHNIQUE: Multidetector CT imaging of the chest was performed following the standard protocol without IV contrast. COMPARISON:  April 24, 2021. FINDINGS: Cardiovascular: No significant vascular findings. Normal heart size. No pericardial effusion. Mediastinum/Nodes: Tracheostomy tube is in grossly good position. Thyroid gland is unremarkable. No adenopathy is noted. The esophagus is unremarkable. Lungs/Pleura: No pneumothorax or pleural effusion is  noted. Minimal bibasilar subsegmental atelectasis is noted. Upper Abdomen: Hepatic steatosis. Musculoskeletal: No chest wall mass or suspicious bone lesions identified. IMPRESSION: Minimal bibasilar subsegmental atelectasis. Hepatic steatosis. Tracheostomy tube is in good position. No other abnormality seen in the chest. Electronically Signed   By: Lupita Raider M.D.   On: 06/04/2021 14:41   DG Chest Port 1 View  Result Date: 06/04/2021 CLINICAL DATA:  Cough and shortness of breath. EXAM: PORTABLE CHEST 1 VIEW COMPARISON:  Chest x-ray dated January 06, 2021. FINDINGS: Unchanged tracheostomy tube. The heart size and mediastinal contours are within normal limits. Normal pulmonary vascularity. No focal consolidation, pleural effusion, or pneumothorax. No acute osseous abnormality. IMPRESSION: No active disease. Electronically Signed   By: Obie Dredge M.D.   On: 06/04/2021 13:13    Procedures Procedures   Medications Ordered in ED Medications  benzonatate (TESSALON) capsule 100 mg (has no administration in time range)  ibuprofen (ADVIL) tablet 600 mg (has no administration in time range)  ipratropium-albuterol (DUONEB) 0.5-2.5 (3) MG/3ML nebulizer solution 3 mL (3 mLs Nebulization Given 06/04/21 1239)    ED Course  I have reviewed the triage vital signs and the nursing notes.  Pertinent labs & imaging results that were available during my care of the patient were reviewed by me and considered in my medical decision making (see chart for details).    MDM Rules/Calculators/A&P                           Katreena Schupp presents with shortness of breath.  This is a somewhat chronic issue, and she saw her pulmonologist for it.  She was placed on 1 month of azithromycin.  She is currently at her baseline oxygen settings.  She was slightly tachycardic and mildly tachypneic.  She was evaluated for acute worsening of her underlying chronic illness.  No evidence of pneumonia.  No evidence of ACS.  No  incidence of congestive heart failure.  No evidence of PE.  She will be discharged home. Final Clinical Impression(s) / ED Diagnoses Final diagnoses:  SOB (shortness of breath)  Restrictive lung disease  Subglottic stenosis  Hx of tracheostomy    Rx / DC Orders ED Discharge Orders     None        Koleen Distance, MD 06/04/21 1456

## 2021-06-04 NOTE — ED Triage Notes (Addendum)
Pt c/o sob for the past couple of weeks, pt has trach. Denies a lot of secretions. Pt is on 5L o2 [place] at home. Diagnosed with PNA about 2 weeks ago.

## 2021-06-04 NOTE — Progress Notes (Signed)
RT assessed patient upon arrival to room. History of trach x4 years. 6.0 cuffless shiley with PMV use and wears 5LNC at all times. SOB noted but SAT 100%. BBS clear. Stated she has not had any sectretions, no need to suction. Stoma clean and dry, trach secure.

## 2022-08-12 IMAGING — CT CT CHEST W/O CM
2 of 4 series · 15 of 36 positions shown, 18 images · non-contrast
Comparison: April 24, 2021.

CLINICAL DATA: Shortness of breath.

EXAM:
CT CHEST WITHOUT CONTRAST
TECHNIQUE: Multidetector CT imaging of the chest was performed following the
standard protocol without IV contrast.

[Series 2: thorax · axial · 0.74mm/px · z∈[-206,+36]mm · 12 of 141 slices shown, 15 images]
[im 10/141  mediastinal]
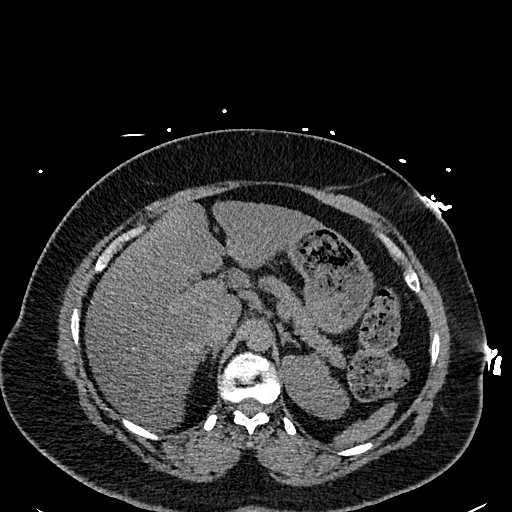
[im 10/141  lung]
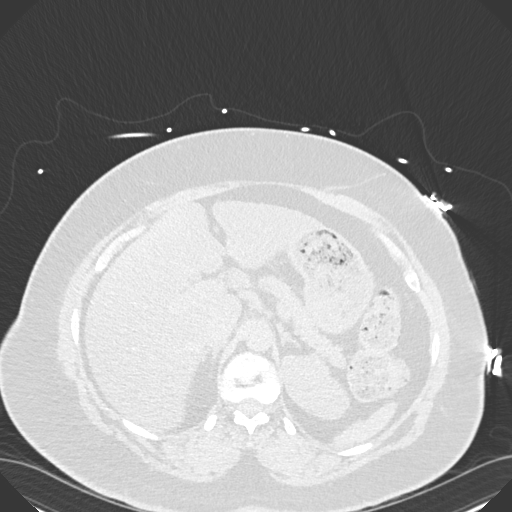
[im 19/141  lung]
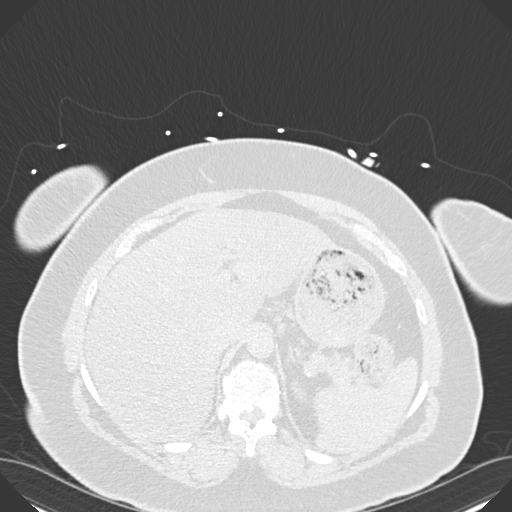
[im 29/141  lung]
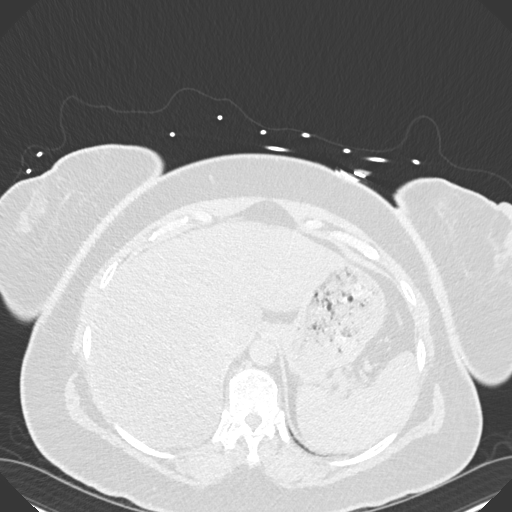
[im 47/141  lung]
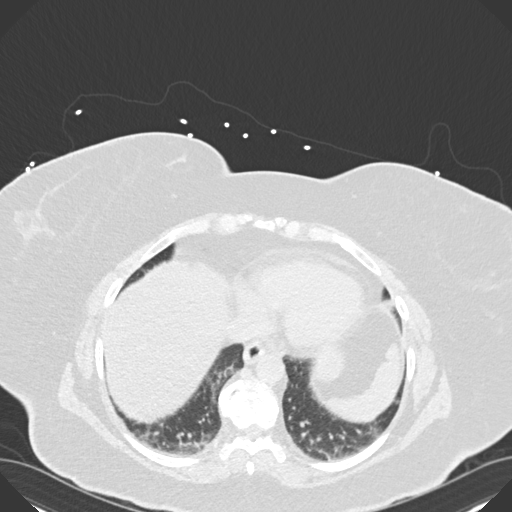
[im 57/141  mediastinal]
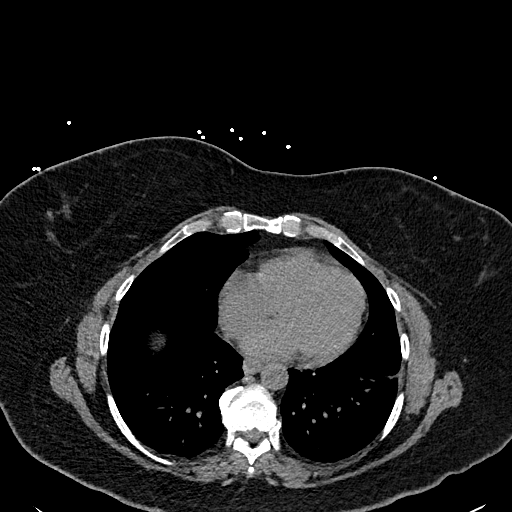
[im 57/141  lung]
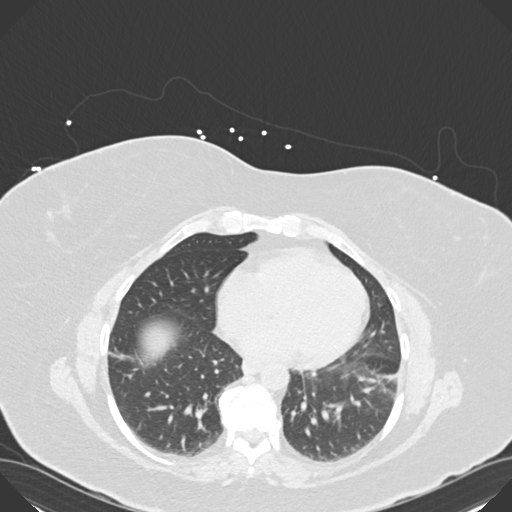
[im 66/141  lung]
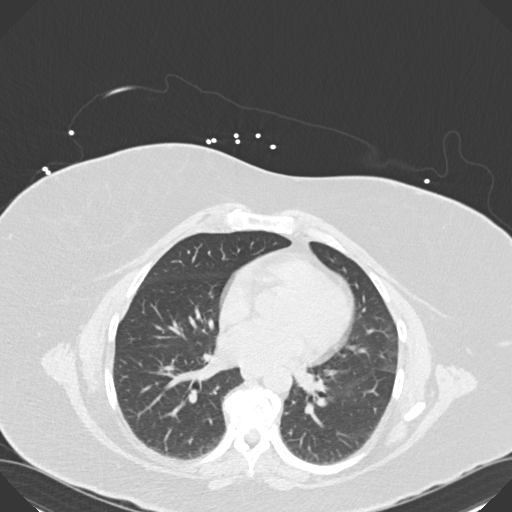
[im 75/141  lung]
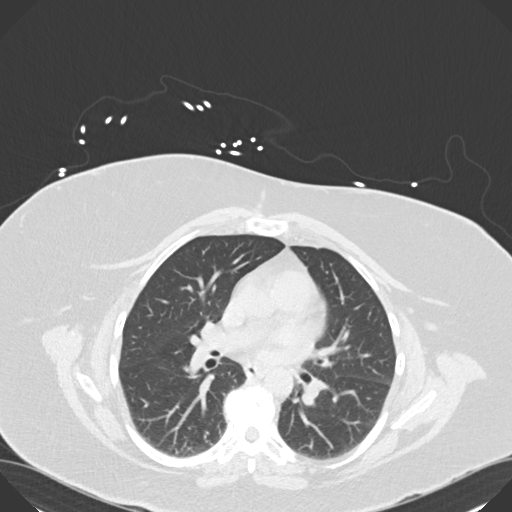
[im 85/141  lung]
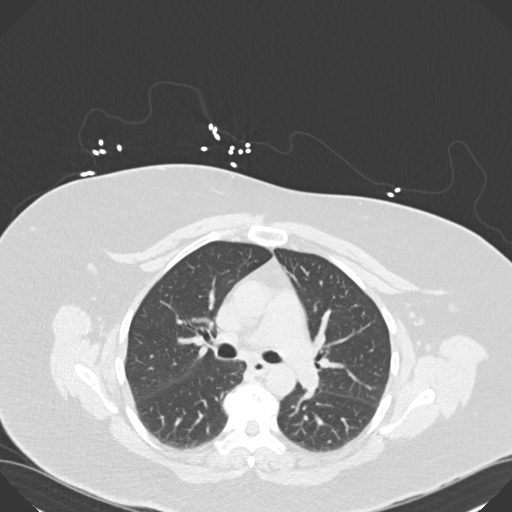
[im 94/141  mediastinal]
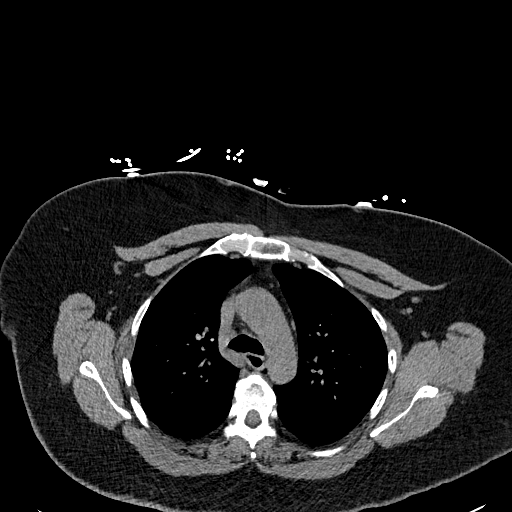
[im 94/141  lung]
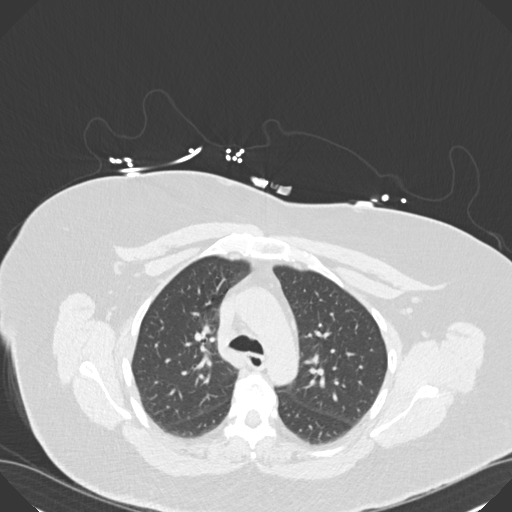
[im 113/141  lung]
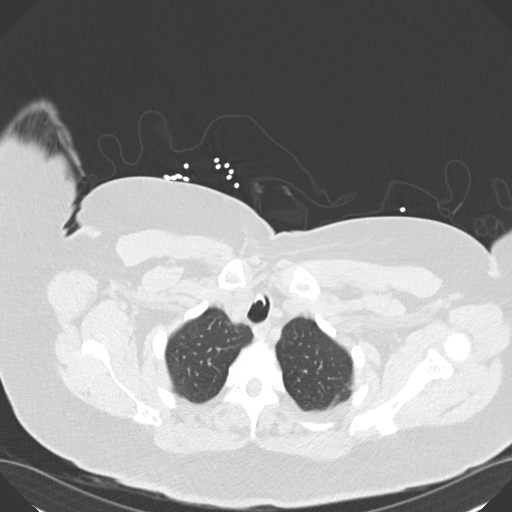
[im 122/141  lung]
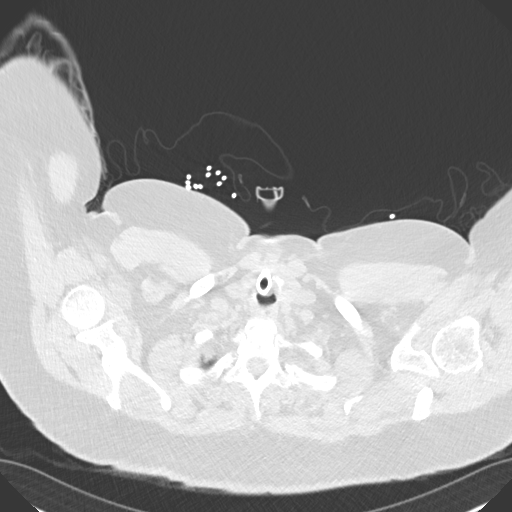
[im 131/141  lung]
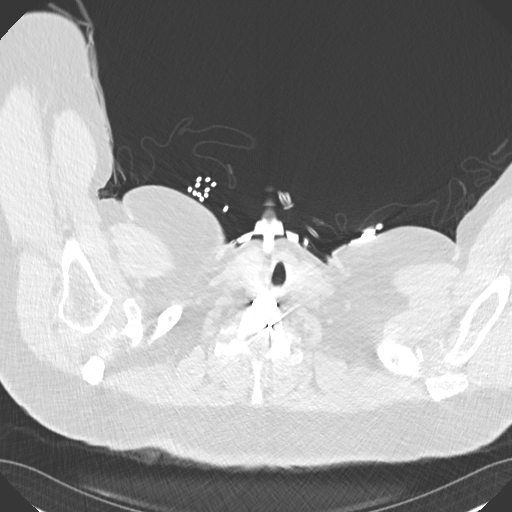

[Series 5: coronal · coronal · 0.58mm/px · 3 of 138 slices shown]
[im 28/138  lung]
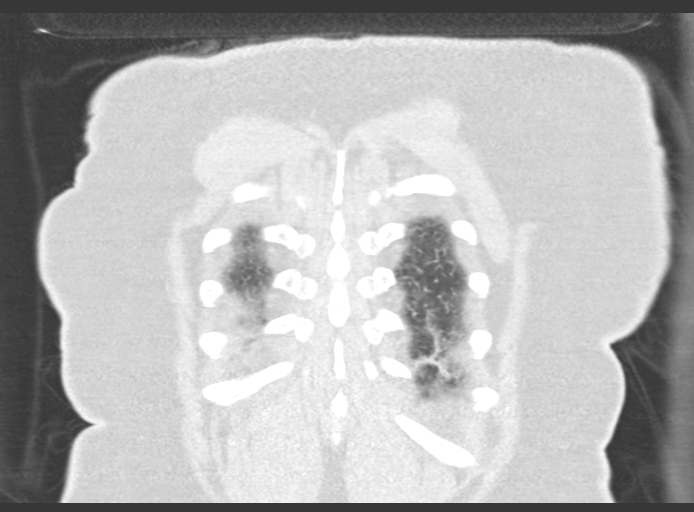
[im 55/138  lung]
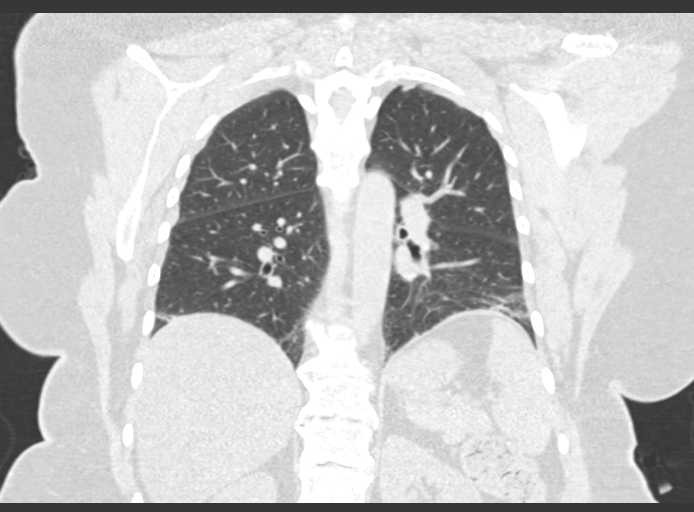
[im 83/138  lung]
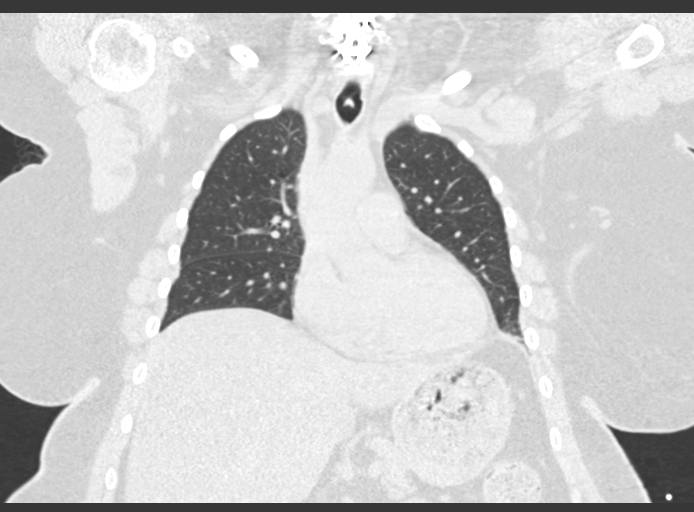

[15 of 36 positions shown; findings below may reference images not displayed]

FINDINGS: Cardiovascular: No significant vascular findings. Normal heart size.
No pericardial effusion.

Mediastinum/Nodes: Tracheostomy tube is in grossly good position.
Thyroid gland is unremarkable. No adenopathy is noted. The esophagus
is unremarkable.

Lungs/Pleura: No pneumothorax or pleural effusion is noted. Minimal
bibasilar subsegmental atelectasis is noted.

Upper Abdomen: Hepatic steatosis.

Musculoskeletal: No chest wall mass or suspicious bone lesions
identified.
IMPRESSION: Minimal bibasilar subsegmental atelectasis.

Hepatic steatosis.

Tracheostomy tube is in good position.

No other abnormality seen in the chest.
